# Patient Record
Sex: Female | Born: 1935 | Race: White | Hispanic: No
Health system: Southern US, Community
[De-identification: ages and names within clinical notes are randomized; demographics above are authoritative.]

---

## 2003-08-11 ENCOUNTER — Other Ambulatory Visit: Payer: Self-pay

## 2004-05-17 ENCOUNTER — Ambulatory Visit: Payer: Self-pay | Admitting: Pain Medicine

## 2004-05-31 ENCOUNTER — Ambulatory Visit: Payer: Self-pay | Admitting: Physician Assistant

## 2004-06-07 ENCOUNTER — Ambulatory Visit: Payer: Self-pay | Admitting: Pain Medicine

## 2004-06-21 ENCOUNTER — Ambulatory Visit: Payer: Self-pay | Admitting: Pain Medicine

## 2004-07-04 ENCOUNTER — Ambulatory Visit: Payer: Self-pay | Admitting: Physician Assistant

## 2004-07-17 ENCOUNTER — Ambulatory Visit: Payer: Self-pay | Admitting: Pain Medicine

## 2004-08-02 ENCOUNTER — Ambulatory Visit: Payer: Self-pay | Admitting: Physician Assistant

## 2004-08-03 ENCOUNTER — Ambulatory Visit: Payer: Self-pay | Admitting: Internal Medicine

## 2004-08-15 ENCOUNTER — Ambulatory Visit: Payer: Self-pay | Admitting: Pain Medicine

## 2004-10-16 ENCOUNTER — Ambulatory Visit: Payer: Self-pay | Admitting: Unknown Physician Specialty

## 2004-11-27 ENCOUNTER — Inpatient Hospital Stay: Payer: Self-pay | Admitting: Unknown Physician Specialty

## 2005-01-24 ENCOUNTER — Ambulatory Visit: Payer: Self-pay | Admitting: Internal Medicine

## 2005-02-07 ENCOUNTER — Ambulatory Visit: Payer: Self-pay

## 2005-03-25 ENCOUNTER — Ambulatory Visit: Payer: Self-pay | Admitting: Internal Medicine

## 2005-04-22 ENCOUNTER — Ambulatory Visit: Payer: Self-pay | Admitting: Internal Medicine

## 2006-02-27 ENCOUNTER — Ambulatory Visit: Payer: Self-pay | Admitting: Internal Medicine

## 2006-08-12 ENCOUNTER — Ambulatory Visit: Payer: Self-pay | Admitting: Internal Medicine

## 2006-08-21 ENCOUNTER — Ambulatory Visit: Payer: Self-pay | Admitting: Internal Medicine

## 2006-11-10 ENCOUNTER — Ambulatory Visit: Payer: Self-pay | Admitting: Specialist

## 2007-03-16 ENCOUNTER — Ambulatory Visit: Payer: Self-pay | Admitting: Internal Medicine

## 2008-01-19 ENCOUNTER — Ambulatory Visit: Payer: Self-pay | Admitting: Specialist

## 2009-11-13 ENCOUNTER — Ambulatory Visit: Payer: Self-pay | Admitting: Neurology

## 2010-11-29 ENCOUNTER — Inpatient Hospital Stay: Payer: Self-pay | Admitting: Specialist

## 2010-12-05 LAB — PATHOLOGY REPORT

## 2011-08-03 ENCOUNTER — Emergency Department: Payer: Self-pay | Admitting: Emergency Medicine

## 2011-12-04 LAB — COMPREHENSIVE METABOLIC PANEL
Alkaline Phosphatase: 111 U/L (ref 50–136)
Anion Gap: 9 (ref 7–16)
BUN: 33 mg/dL — ABNORMAL HIGH (ref 7–18)
Bilirubin,Total: 0.5 mg/dL (ref 0.2–1.0)
Calcium, Total: 9.2 mg/dL (ref 8.5–10.1)
Chloride: 103 mmol/L (ref 98–107)
EGFR (Non-African Amer.): 30 — ABNORMAL LOW
Glucose: 88 mg/dL (ref 65–99)
Osmolality: 284 (ref 275–301)
Potassium: 3.5 mmol/L (ref 3.5–5.1)
SGOT(AST): 21 U/L (ref 15–37)
SGPT (ALT): 7 U/L — ABNORMAL LOW
Total Protein: 7.1 g/dL (ref 6.4–8.2)

## 2011-12-04 LAB — TROPONIN I
Troponin-I: <0.02 ng/mL
Troponin-I: <0.02 ng/mL

## 2011-12-04 LAB — CBC
HGB: 12.9 g/dL (ref 12.0–16.0)
MCH: 29.8 pg (ref 26.0–34.0)
MCV: 89 fL (ref 80–100)
Platelet: 244 10*3/uL (ref 150–440)
RDW: 12.9 % (ref 11.5–14.5)

## 2011-12-04 LAB — URINALYSIS, COMPLETE
Bilirubin,UR: NEGATIVE
Blood: NEGATIVE
Ketone: NEGATIVE
Nitrite: POSITIVE
Ph: 7 (ref 4.5–8.0)
RBC,UR: <1 /HPF (ref 0–5)
Specific Gravity: 1.006 (ref 1.003–1.030)
WBC UR: 4 /HPF (ref 0–5)

## 2011-12-04 LAB — CK TOTAL AND CKMB (NOT AT ARMC)
CK, Total: 85 U/L (ref 21–215)
CK, Total: 82 U/L (ref 21–215)
CK-MB: 2.2 ng/mL (ref 0.5–3.6)

## 2011-12-05 ENCOUNTER — Inpatient Hospital Stay: Payer: Self-pay | Admitting: Internal Medicine

## 2011-12-05 LAB — LIPID PANEL
Cholesterol: 161 mg/dL (ref 0–200)
Ldl Cholesterol, Calc: 92 mg/dL (ref 0–100)
Triglycerides: 187 mg/dL (ref 0–200)
VLDL Cholesterol, Calc: 37 mg/dL (ref 5–40)

## 2011-12-05 LAB — CBC WITH DIFFERENTIAL/PLATELET
Basophil #: 0 10*3/uL (ref 0.0–0.1)
Eosinophil #: 0.3 10*3/uL (ref 0.0–0.7)
HGB: 12.3 g/dL (ref 12.0–16.0)
MCHC: 32.9 g/dL (ref 32.0–36.0)
Monocyte #: 0.5 x10 3/mm (ref 0.2–0.9)
Monocyte %: 6.8 %
Neutrophil #: 6.2 10*3/uL (ref 1.4–6.5)
Neutrophil %: 77.6 %
RBC: 4.14 10*6/uL (ref 3.80–5.20)
RDW: 13.1 % (ref 11.5–14.5)

## 2011-12-05 LAB — COMPREHENSIVE METABOLIC PANEL
Alkaline Phosphatase: 100 U/L (ref 50–136)
Anion Gap: 7 (ref 7–16)
BUN: 22 mg/dL — ABNORMAL HIGH (ref 7–18)
Calcium, Total: 8.8 mg/dL (ref 8.5–10.1)
Chloride: 107 mmol/L (ref 98–107)
Co2: 26 mmol/L (ref 21–32)
EGFR (African American): 43 — ABNORMAL LOW
Osmolality: 283 (ref 275–301)
Potassium: 3.3 mmol/L — ABNORMAL LOW (ref 3.5–5.1)
Sodium: 140 mmol/L (ref 136–145)

## 2011-12-05 LAB — HEMOGLOBIN A1C: Hemoglobin A1C: 6.1 % (ref 4.2–6.3)

## 2011-12-06 LAB — URINE CULTURE

## 2011-12-07 LAB — BASIC METABOLIC PANEL
Calcium, Total: 8.8 mg/dL (ref 8.5–10.1)
Chloride: 107 mmol/L (ref 98–107)
EGFR (Non-African Amer.): 41 — ABNORMAL LOW
Potassium: 3.7 mmol/L (ref 3.5–5.1)

## 2011-12-15 ENCOUNTER — Inpatient Hospital Stay: Payer: Self-pay | Admitting: Internal Medicine

## 2011-12-15 LAB — URINALYSIS, COMPLETE
Bacteria: NONE SEEN
Bilirubin,UR: NEGATIVE
Blood: NEGATIVE
Hyaline Cast: 1
RBC,UR: 2 /HPF (ref 0–5)
Specific Gravity: 1.024 (ref 1.003–1.030)
WBC UR: 2 /HPF (ref 0–5)

## 2011-12-15 LAB — COMPREHENSIVE METABOLIC PANEL
BUN: 26 mg/dL — ABNORMAL HIGH (ref 7–18)
Calcium, Total: 9.5 mg/dL (ref 8.5–10.1)
Chloride: 102 mmol/L (ref 98–107)
Co2: 26 mmol/L (ref 21–32)
Creatinine: 1.25 mg/dL (ref 0.60–1.30)
EGFR (African American): 45 — ABNORMAL LOW
EGFR (Non-African Amer.): 39 — ABNORMAL LOW
Potassium: 3.6 mmol/L (ref 3.5–5.1)

## 2011-12-15 LAB — DRUG SCREEN, URINE
Barbiturates, Ur Screen: NEGATIVE (ref ?–200)
MDMA (Ecstasy)Ur Screen: NEGATIVE (ref ?–500)
Methadone, Ur Screen: NEGATIVE (ref ?–300)
Opiate, Ur Screen: NEGATIVE (ref ?–300)
Tricyclic, Ur Screen: NEGATIVE (ref ?–1000)

## 2011-12-15 LAB — CBC
HGB: 13.4 g/dL (ref 12.0–16.0)
MCHC: 32.8 g/dL (ref 32.0–36.0)
MCV: 90 fL (ref 80–100)
Platelet: 343 10*3/uL (ref 150–440)

## 2011-12-16 LAB — BASIC METABOLIC PANEL
Anion Gap: 8 (ref 7–16)
BUN: 31 mg/dL — ABNORMAL HIGH (ref 7–18)
Calcium, Total: 8.5 mg/dL (ref 8.5–10.1)
Creatinine: 1.18 mg/dL (ref 0.60–1.30)
EGFR (African American): 49 — ABNORMAL LOW
Osmolality: 292 (ref 275–301)
Potassium: 3.2 mmol/L — ABNORMAL LOW (ref 3.5–5.1)

## 2011-12-16 LAB — CBC WITH DIFFERENTIAL/PLATELET
Basophil %: 0.1 %
Eosinophil #: 0 10*3/uL (ref 0.0–0.7)
Lymphocyte #: 0.6 10*3/uL — ABNORMAL LOW (ref 1.0–3.6)
Lymphocyte %: 6.1 %
MCV: 90 fL (ref 80–100)
Monocyte #: 0.5 x10 3/mm (ref 0.2–0.9)
Neutrophil #: 8.7 10*3/uL — ABNORMAL HIGH (ref 1.4–6.5)
RBC: 3.79 10*6/uL — ABNORMAL LOW (ref 3.80–5.20)
RDW: 12.9 % (ref 11.5–14.5)
WBC: 9.9 10*3/uL (ref 3.6–11.0)

## 2011-12-21 LAB — CULTURE, BLOOD (SINGLE)

## 2013-07-05 DEATH — deceased

## 2014-03-31 IMAGING — US US CAROTID DUPLEX BILAT
1 series · 17 of 24 positions shown · non-contrast
Comparison: none

REASON FOR EXAM: cva
COMMENTS:

[Series 1: us carotid duplex bilat · 17 of 65 slices shown]
[im 1/65]
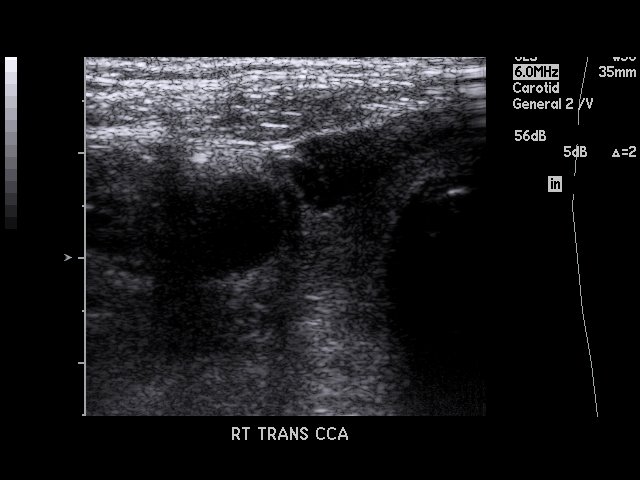
[im 6/65]
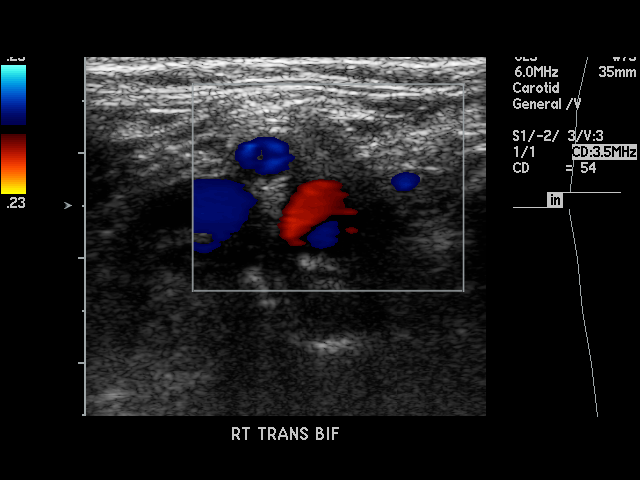
[im 9/65]
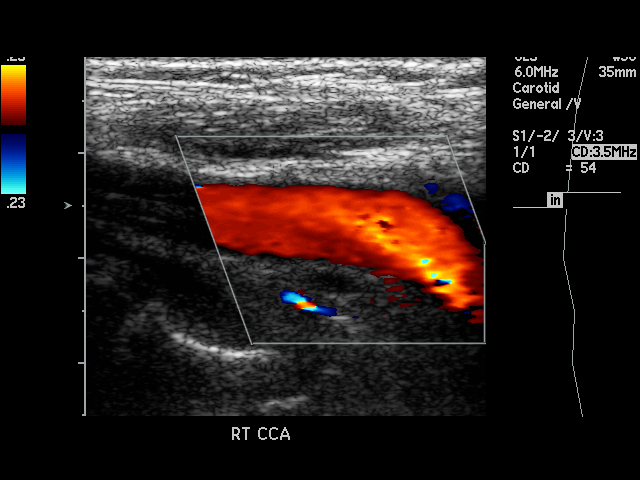
[im 12/65]
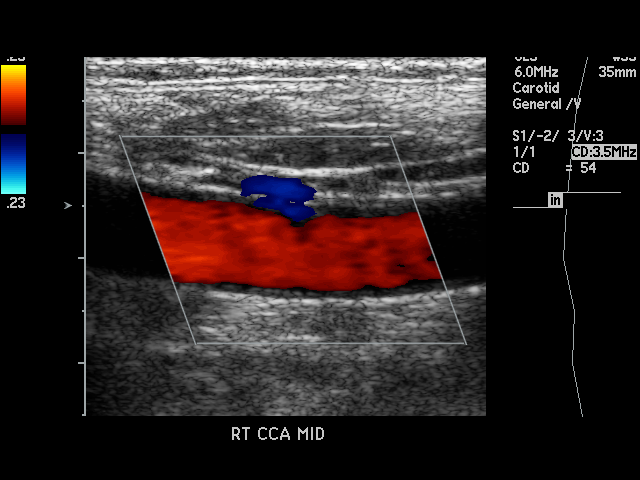
[im 17/65]
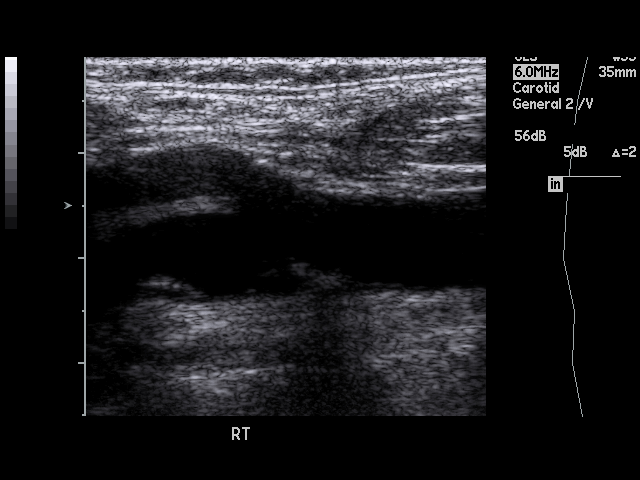
[im 20/65]
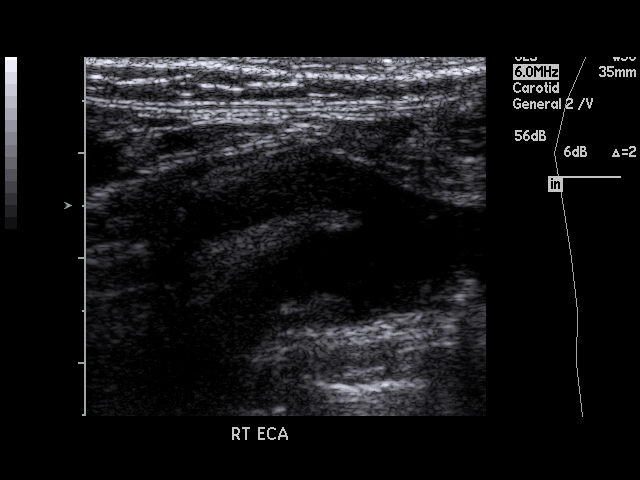
[im 26/65]
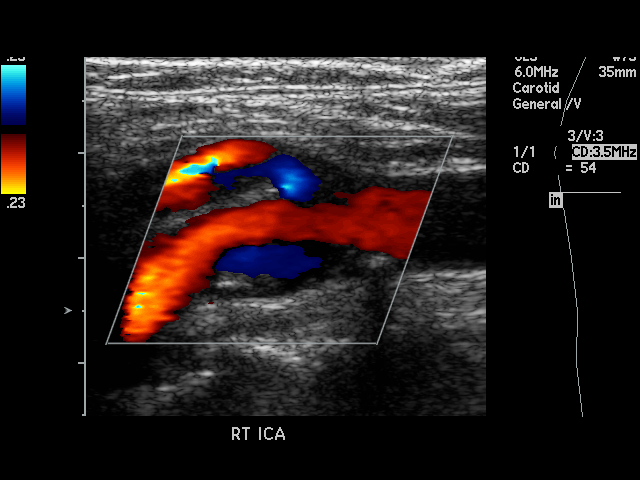
[im 28/65]
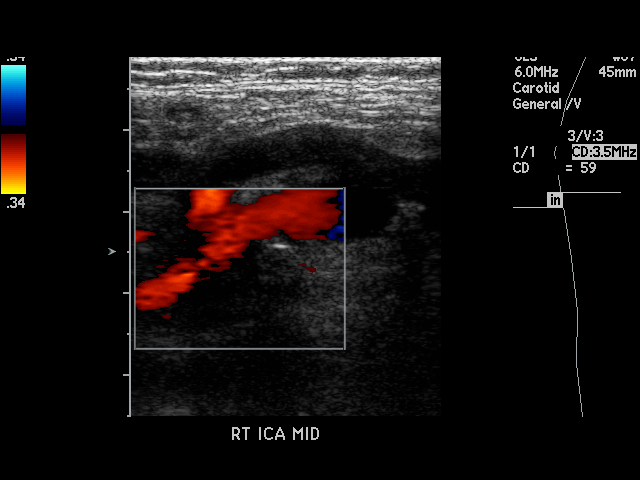
[im 34/65]
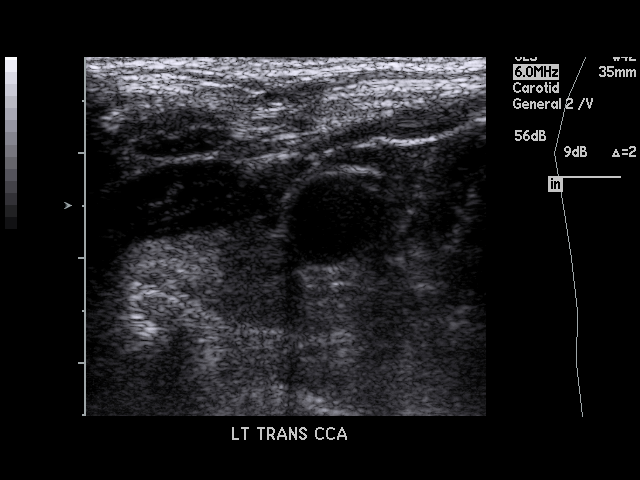
[im 37/65]
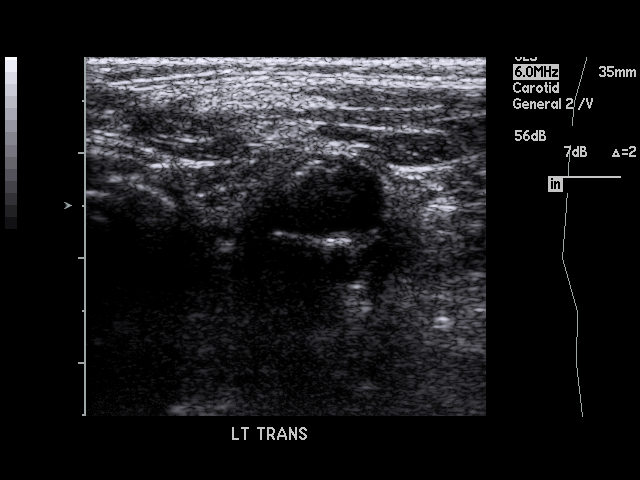
[im 39/65]
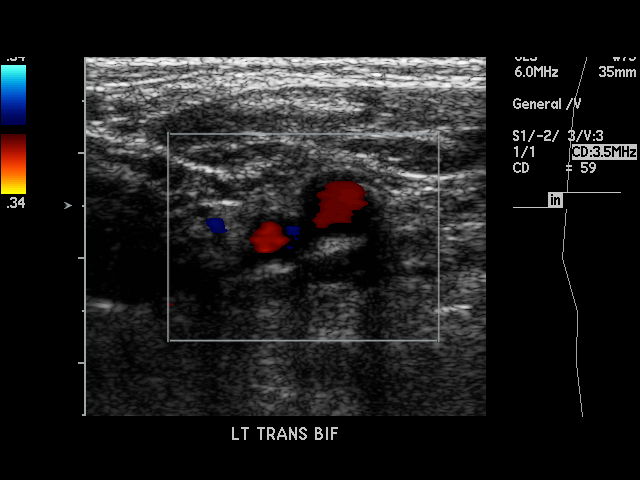
[im 45/65]
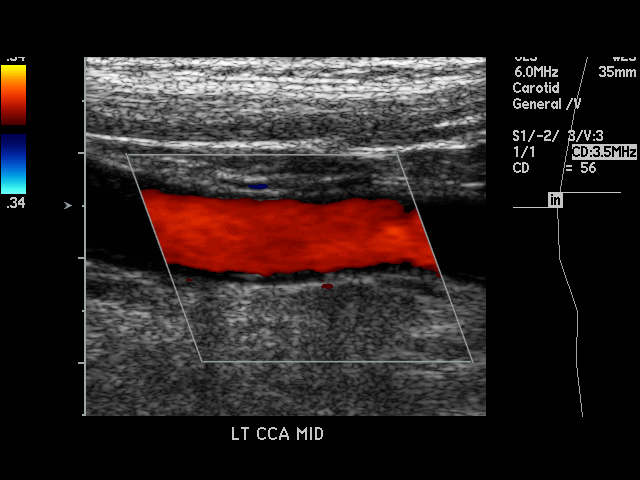
[im 48/65]
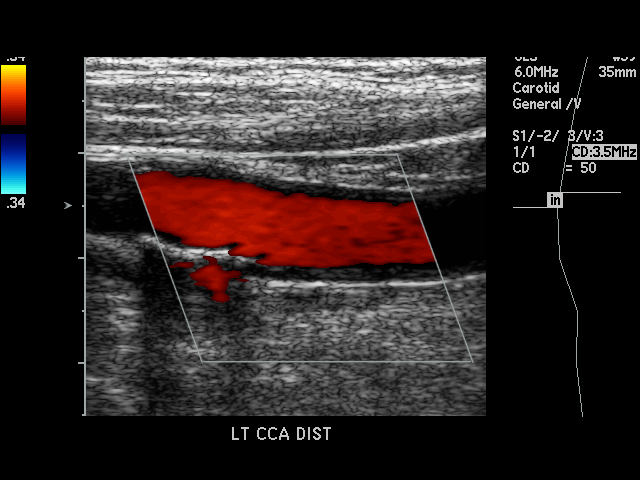
[im 53/65]
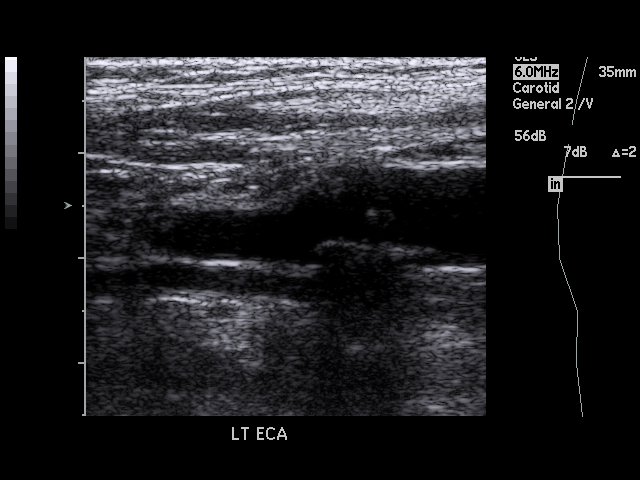
[im 56/65]
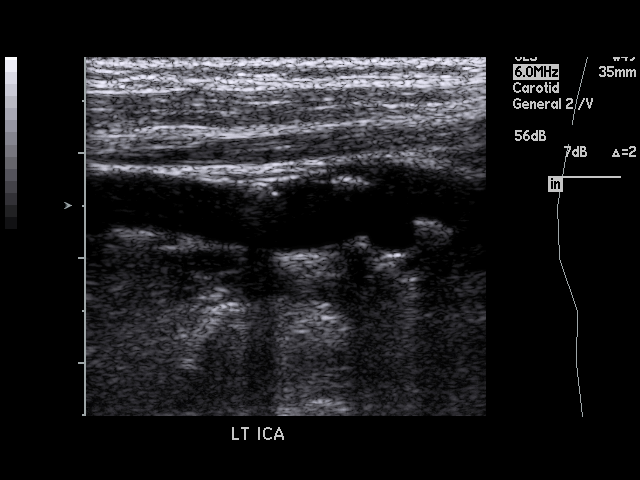
[im 59/65]
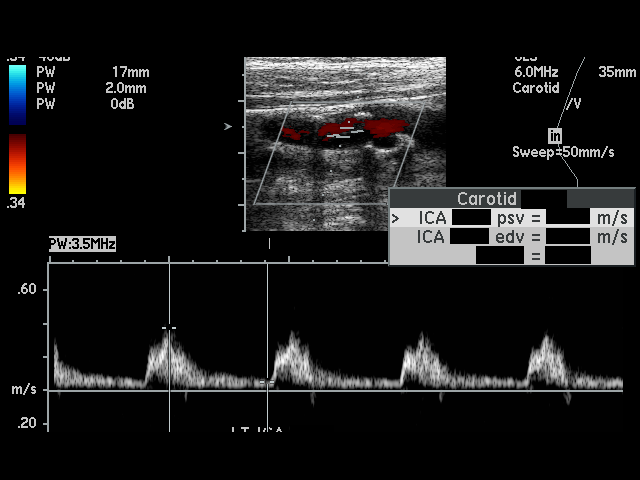
[im 65/65]
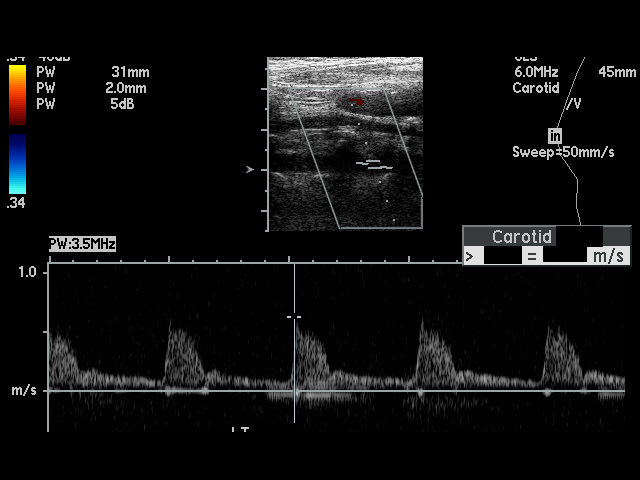

[17 of 24 positions shown; findings below may reference images not displayed]

PROCEDURE:     US  - US CAROTID DOPPLER BILATERAL  - December 04, 2011 [DATE]

RESULT:     There is a small to moderate amount of mixed, soft and calcific
plaque formation about the carotid bifurcations. On the right, the peak
right common carotid artery flow velocity measures 0.626 meters/second and
the peak right internal carotid artery flow velocity measures
meters/second. ICA/CCA ratio is 1.093. On the left, the peak left common
carotid artery velocity measures 0.737 cm/sec and the left internal carotid
artery velocity measures 1.03 meters/second. The ICA/CCA ratio is 1.398.
These values bilaterally are in the normal range and are consistent with the
absence of hemodynamically significant stenosis.

There is observed antegrade flow in both vertebrals.
IMPRESSION: 1. No hemodynamically significant stenosis is identified on either side.
2. There is antegrade flow in both vertebrals.

## 2014-03-31 IMAGING — CT CT HEAD WITHOUT CONTRAST
2 series · 16 of 30 positions shown, 20 images · non-contrast
Comparison: none

REASON FOR EXAM: unwitness fall
COMMENTS:

PROCEDURE:     CT  - CT HEAD WITHOUT CONTRAST  - December 04, 2011 [DATE]
RESULT:     History: Fall.
Comparison Study: Prior CT of 11/29/2010.

[Series 2: without · axial · non-contrast · 0.41mm/px · z∈[+409,+539]mm · 13 of 32 slices shown, 17 images]
[im 3/32  brain]
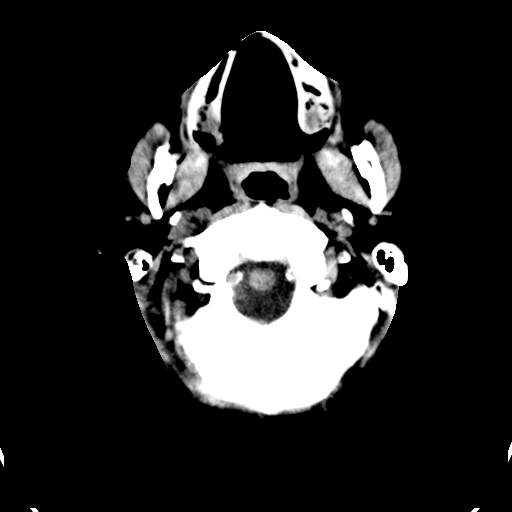
[im 3/32  bone]
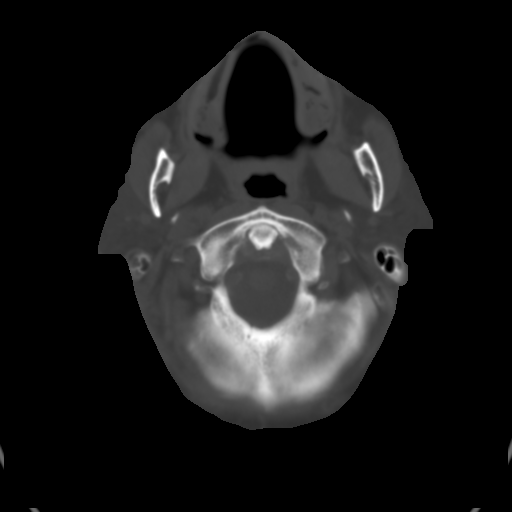
[im 5/32  brain]
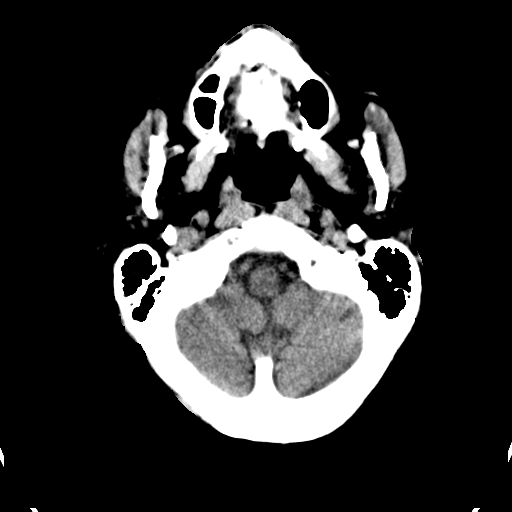
[im 7/32  brain]
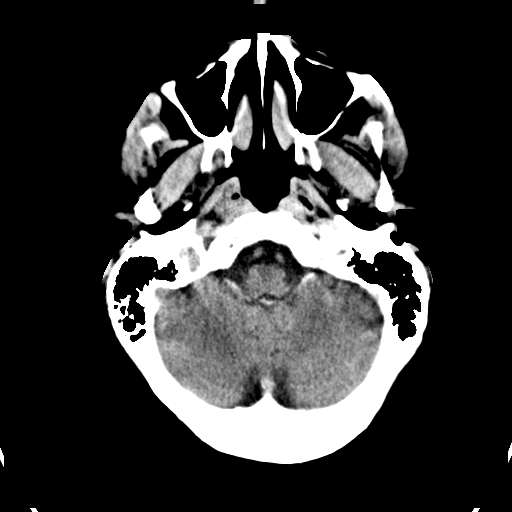
[im 9/32  brain]
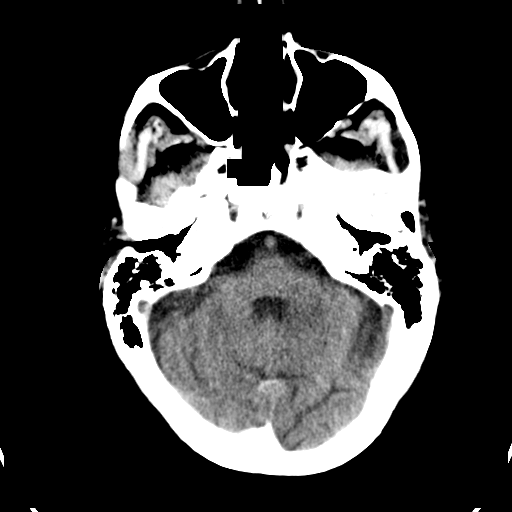
[im 12/32  brain]
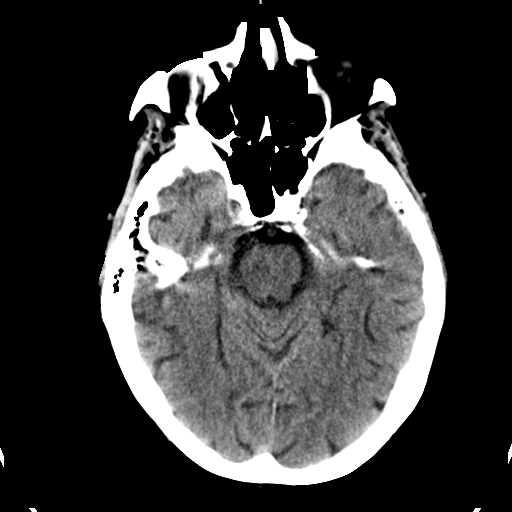
[im 12/32  bone]
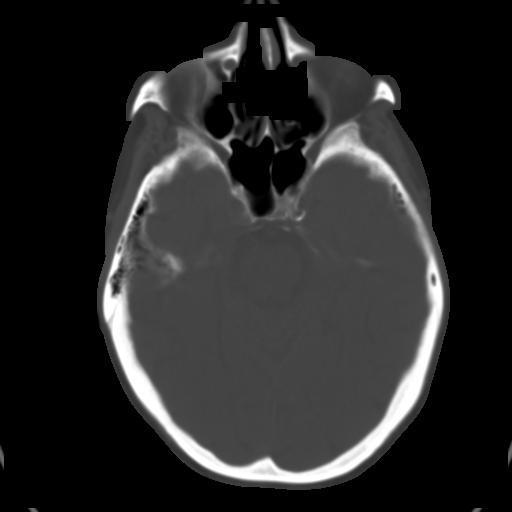
[im 14/32  brain]
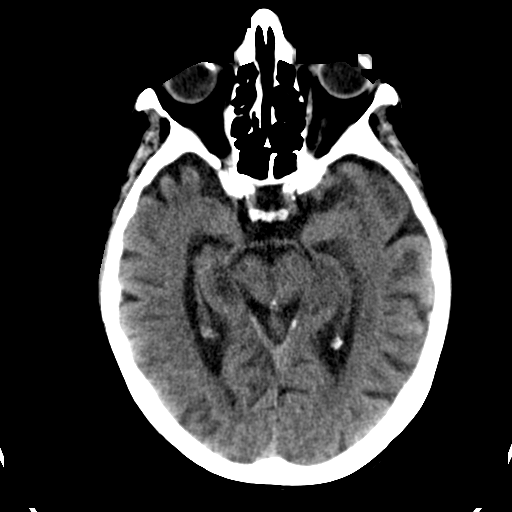
[im 16/32  brain]
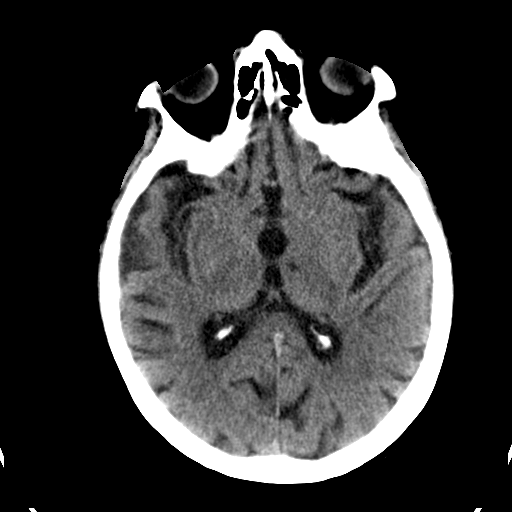
[im 18/32  brain]
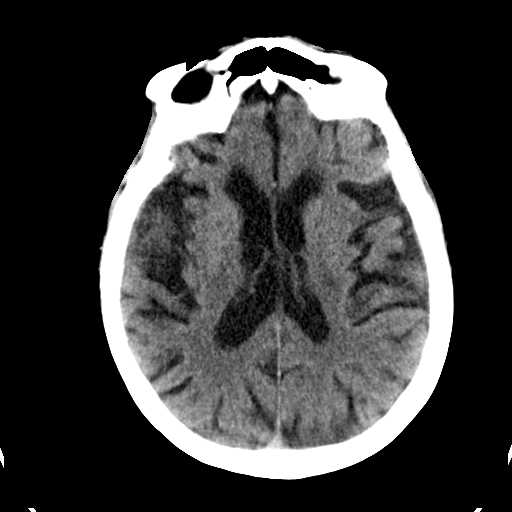
[im 20/32  brain]
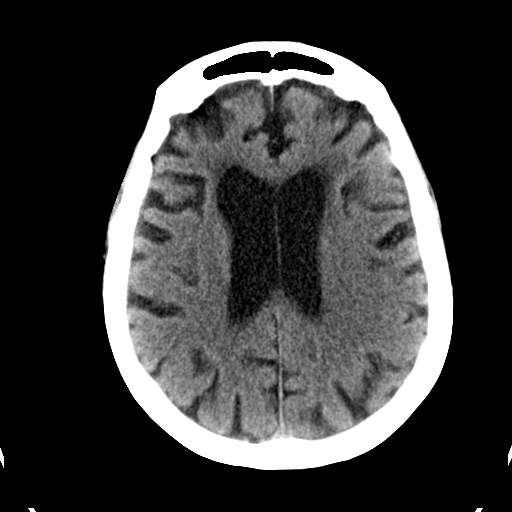
[im 20/32  bone]
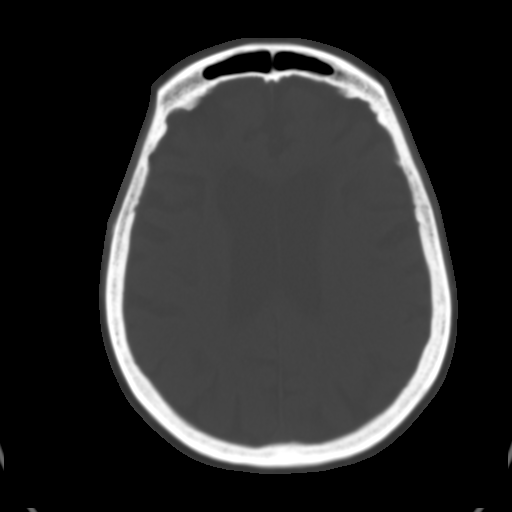
[im 23/32  brain]
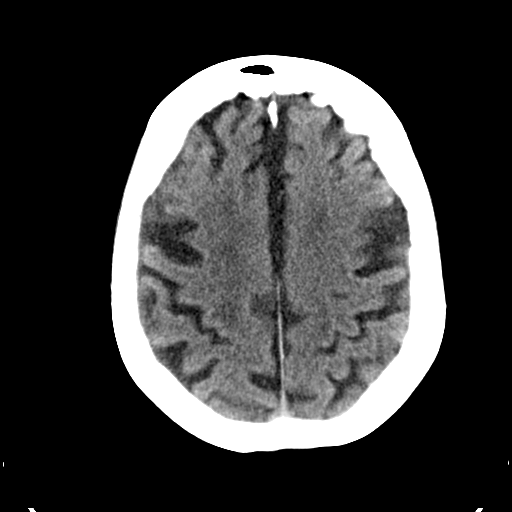
[im 25/32  brain]
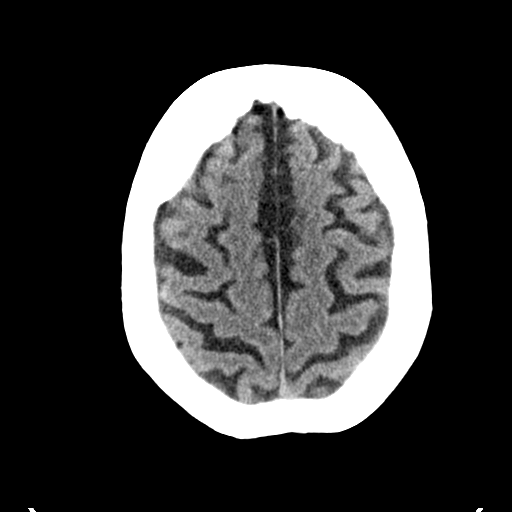
[im 27/32  brain]
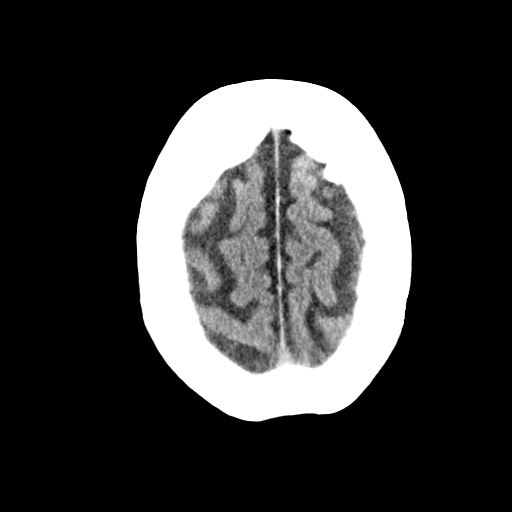
[im 29/32  brain]
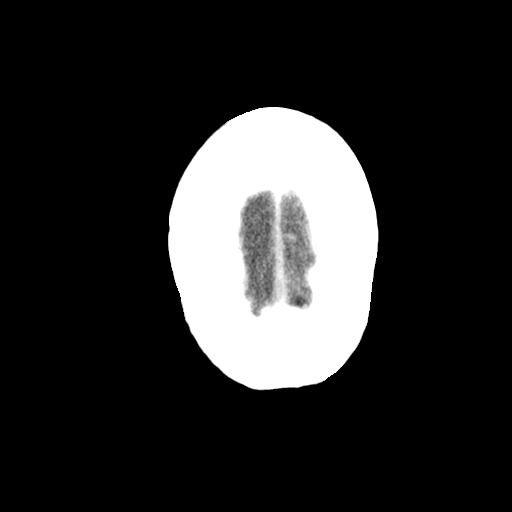
[im 29/32  bone]
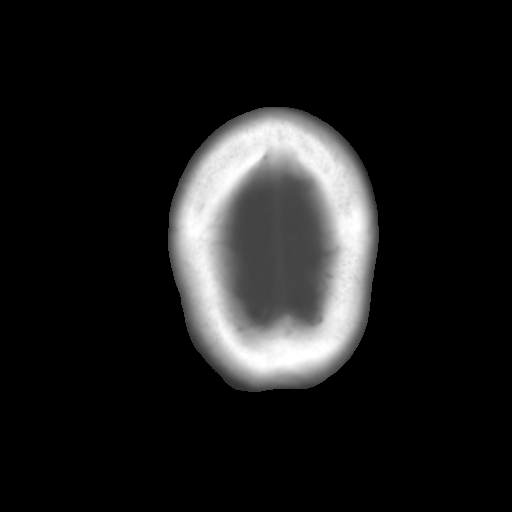

[Series 3: bone · axial · 0.41mm/px · z∈[+409,+454]mm · 3 of 32 slices shown]
[im 3/32  bone]
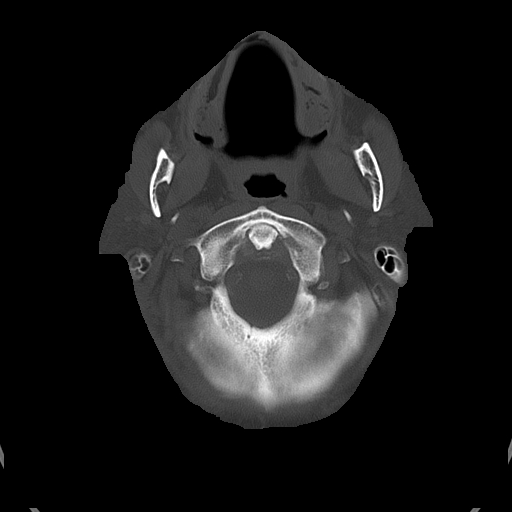
[im 7/32  bone]
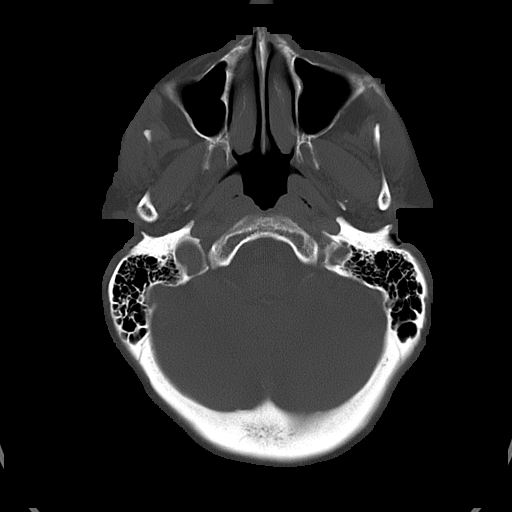
[im 12/32  bone]
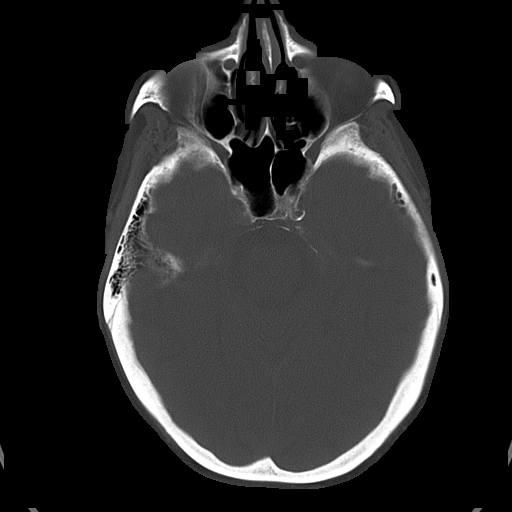

[16 of 30 positions shown; findings below may reference images not displayed]

FINDINGS: Standard nonenhanced CT obtained. No mass. No hydrocephalus. No
hemorrhage. No acute bony abnormality. Severe atrophy.
IMPRESSION: No acute abnormality.

## 2014-11-27 NOTE — H&P (Signed)
PATIENT NAME:  Shelley Walsh, NESSER MR#:  [id number] DATE OF BIRTH:  07/08/1925  DATE OF ADMISSION:  12/04/2011  PRIMARY CARE PHYSICIAN: Dr. Mickey Farber   SUBJECTIVE: This is an 81 year old female from [place] who was brought in to the ER after the patient was found on the floor. According to the daughter who provides most of the history the patient is responsive to sternal rub only. The patient was found on the floor under unknown circumstances. Earlier during the day she also had some nausea and vomiting at the dinner table. The patient subsequently was difficult to arouse. She has underlying dementia and lives in a memory care unit. She denies any fever, chills, cough, sore throat, any diarrhea, any nausea, vomiting or abdominal pain in the last few days. She has been otherwise in good health. She does have a history of transient ischemic attack and urinary tract infection in the past, however, her symptoms have been self limited. This time her symptoms have persisted beyond their usual duration which is what prompted her to bring her to the ER. The patient is otherwise hemodynamically stable in the ED and her workup is essentially negative except for a mild urinary tract infection.   PAST MEDICAL HISTORY: 1. Alzheimer's dementia. 2. Hypertension. 3. Dyslipidemia. 4. Chronic obstructive pulmonary disease. 5. Asthma. 6. Atrial fibrillation not on anticoagulation because of falls. 7. Osteoarthritis.  8. Restless leg syndrome.  9. Parkinson's disease.   PAST SURGICAL HISTORY: None.  ALLERGIES: Aspirin, reaction unknown.  HOME MEDICATIONS: 1. Aldactazide 25/25, 1 tablet p.o. daily. 2. Anusol HC. 3. Aricept 10 mg p.o. daily. 4. Vitamin D with calcium. 5. Sinemet 25/100, 1 tablet t.i.d.  6. Cardizem CD 240 mg p.o. daily in the morning.  7. Cefdinir 300 mg p.o. q.12 hours for 10 days. 8. Ceftin 500 mg b.i.d. for two days. 9. Celexa 20 mg p.o. daily. 10. Cetirizine 10 mg p.o.  daily. 11. Vitamin B12 injection once monthly. 12. Flonase 150 mcg per inhaler, 2 sprays each daily. 13. Folic acid 0.4 mg p.o. daily. 14. Medifin 400 mg p.o. t.i.d.   15. Memantine 5 mg p.o. daily. 16. Omeprazole 20 mg p.o. daily. 17. Oxygen by nasal cannula. 18. Prednisone taper. 19. ProAir as needed. 20. Requip 1 tablet p.o. t.i.d. 21. Bactrim 100 mg p.o. daily. 22. Zocor 10 mg p.o. daily.   FAMILY HISTORY: Reviewed. According to the daughter no family history of hypertension or diabetes.   SOCIAL HISTORY: She lives in a memory care unit. No history of alcohol or smoking.  REVIEW OF SYSTEMS: CONSTITUTIONAL: No history of weight loss, fatigue or weakness. EYES: No history of blurry vision, headache, cataracts or discharge from the eyes. ENT: No history of hearing loss, epistaxis, snoring, postnasal drip. RESPIRATORY: No history of cough, wheezing, hemoptysis or dyspnea. CARDIOVASCULAR: No history of orthopnea, edema, arrhythmia or palpitations. GASTROINTESTINAL: No history of rectal bleeding, constipation, jaundice or change in bowel habits. GENITOURINARY: No history of dysuria, hematuria, frequency or incontinence. HEME: Easy bruising and bleeding. NEUROLOGICAL: Somnolent. History of transient ischemic attack, dementia, fall. PSYCH: Somnolent.   LABORATORY, DIAGNOSTIC AND RADIOLOGICAL DATA: Glucose 88, BUN 33, creatinine 1.56, sodium 139, potassium 3.5, chloride 103, bicarbonate 27, anion gap 9, AST 21, ALT 7, troponin less than 0.02, WBC 7.2, hemoglobin 12.9, hematocrit 38.4, platelet count 244.  Urinalysis shows trace amount of nitrites and 4 WBCs per high-power field.   ASSESSMENT AND PLAN: 1. Altered mental status. The patient has a history of transient ischemic  attack. She was also recently being treated for a urinary tract infection. She also has baseline dementia and Parkinson's disease, stroke   therefore a full workup including an MRI of the brain, 2-D echo and carotid Doppler  will be done. She is allergic to aspirin so if stroke is found she will need to be on Plavix. 2. Urinary tract infection. Start treatment with Rocephin. Patient was on Bactrim prophylaxis prior to admission. Urine culture will be sent.  3. Hypertension. Hold Aldactazide due to renal insufficiency and hydrate gently. 4. History of atrial fibrillation, rate controlled on p.o. Cardizem. The patient is not on anticoagulation because of history of dementia and falls. 5. History of renal insufficiency with a baseline creatinine of 1.2, likely prerenal. Will hold Aldactazide and hydrate with IV fluids. 6. Parkinson's/Alzheimer's dementia. The patient will be continued on her Sinemet. We will hold other medications such as Aricept and Namenda in the setting of her altered mental status. Will also hold Requip.  7. History of asthma. Patient will be continued on albuterol as needed.  8. CODE STATUS: Code status clarified. The daughter would like her to be a FULL CODE at this time. She feels that her mother is not ready yet to be a DO NOT RESUSCITATE/DO NOT INTUBATE.   ____________________________ Richarda OverlieNayana Cantrell Martus, MD na:cms D: 12/04/2011 05:15:56 ET T: 12/04/2011 07:21:03 ET JOB#: [id number]  cc: Richarda OverlieNayana Valencia Kassa, MD, <Dictator> Neomia Dearavid N. Harrington Challengerhies, MD Richarda OverlieNAYANA Genavieve Mangiapane MD ELECTRONICALLY SIGNED 01/15/2012 0:12

## 2014-11-27 NOTE — Discharge Summary (Signed)
PATIENT NAME:  Shelley Walsh, Shelley Walsh MR#:  [id number] DATE OF BIRTH:  08-31-1924  DATE OF ADMISSION:  12/05/2011 DATE OF DISCHARGE:  12/09/2011  ADDENDUM  Patient received her [facility] number and is going to be discharged for rehab today.   ____________________________ Wylie HailSona A. Allena KatzPatel, MD sap:cms D: 12/09/2011 14:11:38 ET T: 12/09/2011 14:26:05 ET JOB#: [id number]  cc: Abdulhadi Stopa A. Allena KatzPatel, MD, <Dictator>  Willow OraSONA A Casondra Gasca MD ELECTRONICALLY SIGNED 12/15/2011 13:55

## 2014-11-27 NOTE — Discharge Summary (Signed)
PATIENT NAME:  Shelley Walsh, Shelley Walsh MR#:  [id number] DATE OF BIRTH:  [birth year]  DATE OF ADMISSION:  12/05/2011 DATE OF DISCHARGE:  12/08/2011   PRESENTING COMPLAINT: Altered mental status.   DISCHARGE DIAGNOSES:  1. Altered mental status, resolved, presumed due to polypharmacy and mild dehydration. Her work-up for stroke was negative.  2. History of dementia.  3. Severe Parkinson's disease.  4. Fall attributed to mild dehydration and possibly due to Parkinson's as well.  5. Relative hypotension.  6. Acute on chronic CRF, improved with fluids.  7. Chronic atrial fibrillation, not on anticoagulants due to frequent falls, currently on low dose Cardizem.  8. Dysphagia, again could be suspected from Parkinson's disease.   CURRENT DIET: Dysphagia pureed with nectar thick.   CODE STATUS: FULL CODE.   DISPOSITION: The patient is being discharged to [facility].   LABS ON DISCHARGE: Basic metabolic panel within normal limits. CBC within normal limits. Magnesium 1.8. Creatinine on admission was 1.32. Lipid profile within normal limits. Cardiac enzymes x3 negative. Urinalysis negative for urinary tract infection. Urine culture negative. Creatinine was 1.56 on admission. Hemoglobin A1c 6.1.   RADIOLOGICAL STUDIES:  1. CT of the head no acute abnormality.  2. CT of the cervical spine without contrast no acute abnormality.  3. Echo Doppler showed LVEF of 55%, left atrium mildly dilated, right atrium mildly dilated. There is mild to moderate mitral regurgitation. There is mild to moderate tricuspid regurgitation. No apparent source of cerebrovascular accident.  4. Ultrasound carotid Doppler no hemodynamically significant stenosis.  5. MRI of the brain without contrast shows no acute intracranial pathology.   CONSULTATIONS:  1. Physical therapy  2. Speech therapy    BRIEF SUMMARY OF HOSPITAL COURSE:  1. Ms. Shelley Walsh is a very pleasant 81 year old Caucasian female who came in from  [facility] after staff found her on the floor. It is unknown whether the patient fell. There is a history of TIA and dementia. No trauma was noted. She was admitted with altered mental status likely in the setting of polypharmacy along with mild dehydration. Her creatinine was 1.56 on admission and after fluids her creatinine is back to normal at 1. All of her work-up including MRI of the brain, CT of head, echo, and carotid Doppler were negative for CVA. The patient otherwise remained neurologically intact. She did have some pleasant confusion which was attributed to her dehydration and underlying dementia. The patient did not have any trauma either. Her urine culture was negative. Chest x-ray was negative as well.  2. Fall, unclear etiology. The patient's medicine list has been re-evaluated and Zocor, Plavix, and Requip were discontinued. This was discussed with Dr. Jacques NavyAhmadzia and with the patient's daughter. She is agreeable with it. Physical therapy was continued.  3. History of dementia. The patient was continued on Aricept and Namenda.  4. Parkinson's disease. Sinemet was continued.  5. Acute on chronic renal failure, improved with IV fluids.  6. Atrial fibrillation, which is chronic, not on anticoagulants due to frequent falls. The patient's calcium channel blocker was continued. She was changed to 60 mg p.o. b.i.d. as she was not able to swallow her sustained-release p.o. Rate is acceptable.   7. Dysphagia. Speech therapy saw the patient. Changed to nectar thick liquid and puree dysphagia diet I which she was tolerating well.   DISCHARGE MEDICATIONS:  1. Aricept 10 mg daily.  2. Namenda 5 mg daily.  3. Cardizem 60 mg b.i.d.  4. Omeprazole 20 mg daily.  5. Calcium with Vitamin D 600/400 international units daily.  6. Folic acid 0.4 mg daily.  7. ProAir HFA 90 mcg/inhalation 2 puffs q.i.d.  8. Citalopram 20 mg daily.  9. Flonase nasal spray at bedtime.  10. Sinemet 25/100 1 tablet oral t.i.d.    DISCHARGE INSTRUCTIONS:  1. Physical therapy.  2. Dysphagia I pureed nectar thick liquid diet.  3. Ensure variety 237 mL t.i.d. with meals.   DISCHARGE PLANNING: [facility] today.   The discharge plan was discussed with the patient's daughter who is the HPOA.Marland Kitchen    TIME SPENT: 40 minutes.   ____________________________ Wylie Hail Allena Katz, MD sap:drc D: 12/08/2011 10:10:07 ET T: 12/08/2011 10:30:54 ET JOB#: [id number]  cc: Ipek Westra A. Allena Katz, MD, <Dictator> Willow Ora MD ELECTRONICALLY SIGNED 12/15/2011 13:55

## 2014-11-27 NOTE — H&P (Signed)
PATIENT NAME:  Shelley Walsh, Shelley Walsh MR#:  [id number] DATE OF BIRTH:  Jul 09, 1925  DATE OF ADMISSION:  12/15/2011  PRIMARY CARE PHYSICIAN: Dr. Maryellen PileEason at [facility]   CHIEF COMPLAINT: Altered mental status.   HISTORY OF PRESENT ILLNESS: Shelley Walsh is a very pleasant 81 year old female who was just admitted recently from 05/02 to 12/08/2011, went to [facility]. She came in last time with altered mental status that was presumed due to polypharmacy and dehydration. Work-up for stroke was essentially negative. She was doing well at her baseline, sent to [facility] on May 6th. The patient was doing well per daughter who gives me most of the history at [facility], however, this past Friday and Saturday the patient's daughter could not visit her at the nursing home. This afternoon she went around 3 p.m. to see the patient. The patient was in her room and was very lethargic and unarousable. The patient's daughter got worried, asked the nurse to come check on her. Her vitals were stable, however, the patient could not keep herself awake. She was then transported to the Emergency Room. En route, EMS found a fentanyl patch stuck on the patient's arm which was put on 12/12/2011. The patient is not on any p.o. narcotics or any patch that was ordered at discharge last time or added at the nursing home. It was an accidental overdose that was noted. The patient's drug screen, however, was negative for opiates here. Next, in the Emergency Room the patient was found to be tachycardic, elevated white count of 17,000. Daughter reports she had vomitus over her clothes when she found her this afternoon and chest x-ray shows minimal early left lower lobe developing infiltrate versus atelectasis. Given her presentation, history of dysphagia, and recent vomiting with elevated white count and tachycardia, aspiration pneumonia was suspected. She is being admitted for  further evaluation and management.   PAST MEDICAL HISTORY:  1. Severe Parkinson's disease.  2. Dementia.  3. CKD, stage II. 4. History of chronic atrial fibrillation, not on anticoagulants due to falls, currently only on low dose Cardizem.  5. Dysphagia suspected from Parkinson's disease. The patient is on dysphagia pureed with nectar thick liquid.   MEDICATIONS: [facility] 1. Calcium with Vitamin D 600/400 one p.o. daily.  2. Carbidopa/levodopa 25/100 one tablet 3 times a day.  3. Citalopram 20 mg p.o. daily.  4. Diltiazem 60 mg p.o. b.i.d.  5. Donezepil 10 mg p.o. daily.  6. Ensure liquid t.i.d. with meals.  7. Flonase 0.05% nasal spray, one spray each nostril for allergic rhinitis daily.  8. Folic acid 0.4 mg p.o. daily.  9. Namenda 5 mg daily.  10. Omeprazole 20 mg p.o. daily. 11. ProAir HFA 90 mcg inhaler daily as needed.   ALLERGIES: Aspirin.   SOCIAL HISTORY: The patient is a resident at [facility]. Nonsmoker. Nonalcoholic.   PAST SURGICAL HISTORY: None.   FAMILY HISTORY: According to the daughter, no family history of hypertension or diabetes.   REVIEW OF SYSTEMS: Not much obtainable since patient is lethargic.   PHYSICAL EXAMINATION:   VITAL SIGNS: The patient is seen in the Emergency Room. She is afebrile, pulse 99, blood pressure 132/59, sats 98% on 2 liters nasal cannula.   HEENT: Atraumatic, normocephalic. Pupils equal, round, and reactive to light and accommodation. Extraocular movements intact. Oral mucosa is dry. The patient is very lethargic.   NECK: Supple. No  JVD. No carotid bruit.   RESPIRATORY: Clear to auscultation bilaterally. Decreased breath sounds in the bases. I could not hear any crackles or rales. No respiratory distress.   CARDIOVASCULAR: Both the heart sounds are normal. Tachycardia present. No murmur heard. PMI not lateralized.   CHEST: Nontender.   ABDOMEN: Soft, benign, nontender. No  organomegaly. Positive bowel sounds.    NEUROLOGICAL: Patient is very lethargic. She awakens and answers few of the questions appropriately and falls back to sleep right away. Detailed neuro exam unable to assess. The patient is able to move all four extremities well. No focal neuro deficit noted.   SKIN: Warm and dry.   LABORATORY, DIAGNOSTIC, AND RADIOLOGICAL DATA: Urinalysis negative for urinary tract infection. Urine drug screen negative. Blood glucose 116, white count 17.6, hemoglobin and hematocrit 13.4 and 40.8, platelet count 343, glucose 113, BUN 26. Rest of the comprehensive metabolic panel within normal limits. Troponin 0.02. EKG shows sinus tachycardia. Chest x-ray possible early left lower lobe infiltrate versus atelectasis.   ASSESSMENT: 81 year old Shelley Walsh comes in secondary to: 1. Altered mental status/lethargy due to accidental overdose on fentanyl patch. The patient had 75 mcg fentanyl patch since 12/12/2011 accidentally placed by RN at nursing home. The patient received 0.4 mg of IV push Narcan. She is arousable, however, falls back to sleep immediately. She responds to verbal commands and falls back to sleep. Vitals are stable. Sats are 98% on 2 liters.  2. Suspected left lower lobe aspiration early pneumonia versus atelectasis/SIRS. Daughter found the patient lethargic today at the nursing home with vomitus over her clothes at the nursing home. There is a possibility she might have aspirated in the setting of accidental overdose with fentanyl patch. The patient's white count is 17K. She is tachycardic and chest x-ray shows early left lower lobe infiltrate.  3. Parkinson's dementia.  4. Hypertension.   PLAN:  1. Admit patient to telemetry floor.  2. FULL CODE.  3. Will keep patient n.p.o. until she is fully awake and alert.  4. Start patient on IV Zosyn and Zithromax for pneumonia.  5. Lovenox for DVT prophylaxis.  6. Follow blood cultures, white count, and  metabolic panel.  7. Resume diet once she is awake.  8. Physical therapy for gait ambulation and training.  9. P.r.n. Zofran for nausea and vomiting.   Above was discussed with the patient's daughter and other family members who were present in the Emergency Room. Dr. Maryellen Pile is aware of the accidental use of fentanyl patch for the patient. He was notified by ER physician, Dr. Mindi Junker.   CRITICAL TIME SPENT: 50 minutes.  ____________________________ Wylie Hail Allena Katz, MD sap:drc D: 12/15/2011 20:58:47 ET T: 12/16/2011 06:47:09 ET JOB#: [id number]  cc: Ludia Gartland A. Allena Katz, MD, <Dictator> Serita Sheller. Maryellen Pile, MD Willow Ora MD ELECTRONICALLY SIGNED 12/16/2011 9:42

## 2014-11-27 NOTE — Discharge Summary (Signed)
PATIENT NAME:  Shelley Walsh, Shelley Walsh MR#:  [id number] DATE OF BIRTH:  09/16/1924  DATE OF ADMISSION:  12/15/2011 DATE OF DISCHARGE:  12/18/2011  ADMISSION DIAGNOSIS: Altered mental status secondary to fentanyl patch.   DISCHARGE DIAGNOSES:  1. Altered mental status secondary to accidental placement of fentanyl patch.  2. Left lower lobe pneumonia.  3. Chronic atrial fibrillation.  4. Parkinson's disease. 5. Gastroesophageal reflux disease.   CONSULTS: None.   LABS/STUDIES: At discharge white blood cells 9.9, hemoglobin 11.2, hematocrit 34.1, and platelets 286. Sodium 143, potassium 3.2, chloride 108, bicarbonate 27, BUN 31, creatinine 1.1, and glucose 105.   Urine toxicology was negative.   Blood cultures are negative to date.   HISTORY OF PRESENT ILLNESS: This is an 81 year old female who presented with altered mental status. For further details, please refer to Dr. Jearl KlinefelterSona Walsh's history and physical.  1. Altered mental status likely medication related. She was found to have a 75 mcg fentanyl patch on her. She is not on any narcotics or pain medications. This was accidentally placed. CT of the head was negative for acute changes, although she does have some true atrophy. Her mental status has improved.  2. Left lower lobe pneumonia, likely secondary to atelectasis. Cultures are negative. She was on azithromycin and Zosyn. She will be discharged with Augmentin to finish on 12/20/2011.  3. Chronic atrial fibrillation. Not on Coumadin secondary to falls. Heart rate was well controlled.  4. Parkinson's disease. The patient was restarted on her medications.    DISCHARGE MEDICATIONS:  1. Diltiazem 60 mg twice a day. 2. Donepezil 10 mg daily.  3. Namenda 5 mg daily.  4. Celexa 20 mg daily.  5. Calcarb with D 600/400 once in the morning.  6. Folic acid 0.4 mg daily.  7. Omeprazole 20 mg daily.  8. Carbidopa/Levodopa 25/100 mg three times daily. 9. Oxygen 2 liters nasal cannula while  sleeping.  10. Flonase two sprays as needed.  11. ProAir as needed.  12. Augmentin 875 mg twice a day through 12/20/2011.   DISCHARGE OXYGEN: 2 liters nasal cannula.  DISCHARGE DIET: Mechanical soft diet, watch for aspiration.  DISCHARGE FOLLOWUP: Followup with Dr. Harrington Challengerhies on Thursday, 12/26/2011, at 11 o'clock .   TIME SPENT: Approximately 35 minutes.  ____________________________ Shelley ContesSital P. Juliene PinaMody, MD spm:slb D: 12/18/2011 12:15:49 ET T: 12/18/2011 12:51:28 ET JOB#: [id number]  cc: Shelley Blossom P. Juliene PinaMody, MD, <Dictator> Neomia Dearavid N. Harrington Challengerhies, MD Shelley ContesSITAL P Tatia Petrucci MD ELECTRONICALLY SIGNED 12/18/2011 14:20
